# Patient Record
Sex: Male | Born: 2000 | Hispanic: No | Marital: Single | Smoking: Never smoker
Health system: Southern US, Community
[De-identification: ages and names within clinical notes are randomized; demographics above are authoritative.]

## PROBLEM LIST (undated history)

## (undated) DIAGNOSIS — E669 Obesity, unspecified: Secondary | ICD-10-CM

## (undated) HISTORY — DX: Obesity, unspecified: E66.9

---
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
## 2021-12-19 ENCOUNTER — Other Ambulatory Visit (HOSPITAL_COMMUNITY): Payer: Self-pay

## 2021-12-19 MED ORDER — MELOXICAM 15 MG PO TABS
15.0000 mg | ORAL_TABLET | Freq: Every day | ORAL | 0 refills | Status: DC
  Filled 2021-12-19: qty 14, 14d supply, fill #0

## 2021-12-19 MED ORDER — CYCLOBENZAPRINE HCL 10 MG PO TABS
10.0000 mg | ORAL_TABLET | Freq: Three times a day (TID) | ORAL | 0 refills | Status: DC | PRN
  Filled 2021-12-19: qty 30, 10d supply, fill #0

## 2022-02-03 ENCOUNTER — Other Ambulatory Visit (HOSPITAL_COMMUNITY): Payer: Self-pay

## 2022-02-03 MED ORDER — MELOXICAM 15 MG PO TABS
15.0000 mg | ORAL_TABLET | Freq: Every day | ORAL | 0 refills | Status: AC
  Filled 2022-02-03: qty 42, 42d supply, fill #0

## 2022-02-03 MED ORDER — METHYLPREDNISOLONE 4 MG PO TBPK
ORAL_TABLET | ORAL | 0 refills | Status: AC
  Filled 2022-02-03: qty 21, 6d supply, fill #0
